# Patient Record
Sex: Female | Born: 1995 | Race: White | Hispanic: No | Marital: Married | Smoking: Never smoker
Health system: Southern US, Community
[De-identification: ages and names within clinical notes are randomized; demographics above are authoritative.]

## PROBLEM LIST (undated history)

## (undated) DIAGNOSIS — J45909 Unspecified asthma, uncomplicated: Secondary | ICD-10-CM

## (undated) DIAGNOSIS — O020 Blighted ovum and nonhydatidiform mole: Secondary | ICD-10-CM

## (undated) DIAGNOSIS — F419 Anxiety disorder, unspecified: Secondary | ICD-10-CM

## (undated) HISTORY — PX: DILATION AND CURETTAGE OF UTERUS: SHX78

## (undated) HISTORY — DX: Anxiety disorder, unspecified: F41.9

## (undated) HISTORY — PX: TONSILLECTOMY: SUR1361

## (undated) HISTORY — DX: Blighted ovum and nonhydatidiform mole: O02.0

---

## 2016-02-14 ENCOUNTER — Emergency Department (INDEPENDENT_AMBULATORY_CARE_PROVIDER_SITE_OTHER)
Admission: EM | Admit: 2016-02-14 | Discharge: 2016-02-14 | Disposition: A | Discharge status: Home / Self Care | Payer: PRIVATE HEALTH INSURANCE | Source: Home / Self Care | Attending: Family Medicine | Admitting: Family Medicine

## 2016-02-14 ENCOUNTER — Encounter: Payer: Self-pay | Admitting: *Deleted

## 2016-02-14 DIAGNOSIS — J069 Acute upper respiratory infection, unspecified: Secondary | ICD-10-CM

## 2016-02-14 DIAGNOSIS — J4521 Mild intermittent asthma with (acute) exacerbation: Secondary | ICD-10-CM | POA: Diagnosis not present

## 2016-02-14 HISTORY — DX: Unspecified asthma, uncomplicated: J45.909

## 2016-02-14 MED ORDER — AZITHROMYCIN 250 MG PO TABS: 250.0000 mg | ORAL_TABLET | Freq: Every day | ORAL | 0 refills | Status: DC

## 2016-02-14 MED ORDER — IPRATROPIUM-ALBUTEROL 0.5-2.5 (3) MG/3ML IN SOLN
3.0000 mL | Freq: Once | RESPIRATORY_TRACT | Status: AC
Start: 2016-02-14 — End: 2016-02-14
  Administered 2016-02-14: 3 mL via RESPIRATORY_TRACT

## 2016-02-14 MED ORDER — PREDNISONE 20 MG PO TABS: ORAL_TABLET | ORAL | 0 refills | Status: DC

## 2016-02-14 MED ORDER — METHYLPREDNISOLONE SODIUM SUCC 40 MG IJ SOLR
80.0000 mg | Freq: Once | INTRAMUSCULAR | Status: AC
  Administered 2016-02-14: 80 mg via INTRAMUSCULAR

## 2016-02-14 MED ORDER — ALBUTEROL SULFATE (2.5 MG/3ML) 0.083% IN NEBU: 2.5000 mg | INHALATION_SOLUTION | Freq: Four times a day (QID) | RESPIRATORY_TRACT | 12 refills | Status: AC | PRN

## 2016-02-14 MED ORDER — BENZONATATE 100 MG PO CAPS: 100.0000 mg | ORAL_CAPSULE | Freq: Three times a day (TID) | ORAL | 0 refills | Status: DC

## 2016-02-14 NOTE — Discharge Instructions (Signed)
°  You were given a shot of solumedrol (a steroid) today to help decrease inflammation in your airway to help improve your cough.  You have been prescribed 5 days of prednisone, an oral steroid.  You may start this medication tomorrow with breakfast.

## 2016-02-14 NOTE — ED Provider Notes (Signed)
CSN: [id number]     Arrival date & time 02/14/16  1306 History   First MD Initiated Contact with Patient 02/14/16 1332     Chief Complaint  Patient presents with  . Cough   (Consider location/radiation/quality/duration/timing/severity/associated sxs/prior Treatment) HPI Theresa Heath is a 20 y.o. female presenting to UC with c/o dry cough for 1 month, which became productive yesterday.  She has a hx of asthma and has needed to use her inhaler more often than she knows she should.  She contacted her teledoc service, was placed on prednisone and tessalon.  Once she completed the prednisone, her cough came back.  She was then prescribed methylprednisolone dose pack, which she completed about 1 week ago.  She feels mild fatigue but otherwise well, no other symptoms. Denies fever, chills, n/v/d. No sick contacts or recent travel.   Past Medical History:  Diagnosis Date  . Asthma    Past Surgical History:  Procedure Laterality Date  . DILATION AND CURETTAGE OF UTERUS    . TONSILLECTOMY     History reviewed. No pertinent family history. Social History  Substance Use Topics  . Smoking status: Never Smoker  . Smokeless tobacco: Never Used  . Alcohol use No   OB History    No data available     Review of Systems  Constitutional: Positive for fatigue. Negative for chills and fever.  HENT: Positive for congestion. Negative for rhinorrhea, sinus pressure and sore throat.   Respiratory: Positive for cough and wheezing. Negative for chest tightness and shortness of breath.   Cardiovascular: Negative for chest pain, palpitations and leg swelling.  Gastrointestinal: Negative for diarrhea, nausea and vomiting.    Allergies  Review of patient's allergies indicates no known allergies.  Home Medications   Prior to Admission medications   Medication Sig Start Date End Date Taking? Authorizing Provider  Albuterol Sulfate (PROAIR HFA IN) Inhale into the lungs.   Yes Historical Provider, MD   Budesonide-Formoterol Fumarate (SYMBICORT IN) Inhale into the lungs.   Yes Historical Provider, MD  cetirizine (ZYRTEC) 10 MG tablet Take 10 mg by mouth daily.   Yes Historical Provider, MD  albuterol (PROVENTIL) (2.5 MG/3ML) 0.083% nebulizer solution Take 3 mLs (2.5 mg total) by nebulization every 6 (six) hours as needed for wheezing or shortness of breath. 02/14/16   Junius Finner, PA-C  azithromycin (ZITHROMAX) 250 MG tablet Take 1 tablet (250 mg total) by mouth daily. Take first 2 tablets together, then 1 every day until finished. 02/14/16   Junius Finner, PA-C  benzonatate (TESSALON) 100 MG capsule Take 1-2 capsules (100-200 mg total) by mouth every 8 (eight) hours. 02/14/16   Junius Finner, PA-C  predniSONE (DELTASONE) 20 MG tablet 3 tabs po day one, then 2 po daily x 4 days 02/14/16   Junius Finner, PA-C   Meds Ordered and Administered this Visit   Medications  methylPREDNISolone sodium succinate (SOLU-MEDROL) 40 mg/mL injection 80 mg (80 mg Intramuscular Given 02/14/16 1346)  ipratropium-albuterol (DUONEB) 0.5-2.5 (3) MG/3ML nebulizer solution 3 mL (3 mLs Nebulization Given 02/14/16 1346)    BP 125/88 (BP Location: Left Arm)   Pulse 111   Temp 98 F (36.7 C) (Oral)   Resp 16   Wt 105 lb (47.6 kg)   LMP 01/21/2016   SpO2 98%  No data found.   Physical Exam  Constitutional: She is oriented to person, place, and time. She appears well-developed and well-nourished. No distress.  HENT:  Head: Normocephalic and atraumatic.  Right Ear:  Tympanic membrane normal.  Left Ear: Tympanic membrane normal.  Nose: Nose normal.  Mouth/Throat: Uvula is midline, oropharynx is clear and moist and mucous membranes are normal.  Eyes: EOM are normal.  Neck: Normal range of motion. Neck supple.  Cardiovascular: Normal rate and regular rhythm.   Pulmonary/Chest: Effort normal. No respiratory distress. She has wheezes ( diffuse, worse in upper lung fields). She has no rales.  Musculoskeletal:  Normal range of motion.  Neurological: She is alert and oriented to person, place, and time.  Skin: Skin is warm and dry. She is not diaphoretic.  Psychiatric: She has a normal mood and affect. Her behavior is normal.  Nursing note and vitals reviewed.   Urgent Care Course   Clinical Course    Procedures (including critical care time)  Labs Review Labs Reviewed - No data to display  Imaging Review No results found.   MDM   1. Acute upper respiratory infection   2. Mild intermittent asthma with exacerbation    Pt c/o 1 month of cough that returns after completing prednisone treatments prescribed by her teledoc.    Wheeze noted on exam w/o evidence of respiratory distress. Tx in UC: Solumedrol 80mg  IM and duoneb. Pt states she feels much better. Wheeze resolved.  Rx: Prednisone (5 days to start tomorrow), tessalon, albuterol nebulizer solution refill, and prescription for Azithromycin to cover for atypical bacteria due to duration of cough.  F/u with PCP in 5-7 days if not improving.     Junius Finnerrin O'Malley, PA-C 02/14/16 [phone]

## 2016-02-14 NOTE — ED Triage Notes (Signed)
Patient c/o 1 month of dry cough that became productive yesterday. Afebrile. H/o asthma, using inhaler frequently. She contacted her teledoc service, placed on prednisone and tessalon. Once completed cough returned. Later prescribed methylprednisolone

## 2016-03-07 ENCOUNTER — Encounter: Payer: Self-pay | Admitting: *Deleted

## 2016-03-07 ENCOUNTER — Emergency Department (INDEPENDENT_AMBULATORY_CARE_PROVIDER_SITE_OTHER)
Admission: EM | Admit: 2016-03-07 | Discharge: 2016-03-07 | Disposition: A | Discharge status: Home / Self Care | Payer: PRIVATE HEALTH INSURANCE | Source: Home / Self Care | Attending: Family Medicine | Admitting: Family Medicine

## 2016-03-07 ENCOUNTER — Emergency Department (INDEPENDENT_AMBULATORY_CARE_PROVIDER_SITE_OTHER): Payer: PRIVATE HEALTH INSURANCE

## 2016-03-07 DIAGNOSIS — J4521 Mild intermittent asthma with (acute) exacerbation: Secondary | ICD-10-CM

## 2016-03-07 DIAGNOSIS — J40 Bronchitis, not specified as acute or chronic: Secondary | ICD-10-CM

## 2016-03-07 DIAGNOSIS — R0602 Shortness of breath: Secondary | ICD-10-CM | POA: Diagnosis not present

## 2016-03-07 DIAGNOSIS — R05 Cough: Secondary | ICD-10-CM

## 2016-03-07 DIAGNOSIS — Z8709 Personal history of other diseases of the respiratory system: Secondary | ICD-10-CM

## 2016-03-07 MED ORDER — AZITHROMYCIN 250 MG PO TABS
250.0000 mg | ORAL_TABLET | Freq: Every day | ORAL | 0 refills | Status: DC
Start: 2016-03-07 — End: 2016-04-12

## 2016-03-07 MED ORDER — BENZONATATE 100 MG PO CAPS: 100.0000 mg | ORAL_CAPSULE | Freq: Three times a day (TID) | ORAL | 0 refills | Status: DC

## 2016-03-07 MED ORDER — METHYLPREDNISOLONE SODIUM SUCC 40 MG IJ SOLR
80.0000 mg | Freq: Once | INTRAMUSCULAR | Status: AC
  Administered 2016-03-07: 80 mg via INTRAMUSCULAR

## 2016-03-07 MED ORDER — IPRATROPIUM-ALBUTEROL 0.5-2.5 (3) MG/3ML IN SOLN
3.0000 mL | Freq: Once | RESPIRATORY_TRACT | Status: AC
  Administered 2016-03-07: 3 mL via RESPIRATORY_TRACT

## 2016-03-07 MED ORDER — PREDNISONE 20 MG PO TABS: ORAL_TABLET | ORAL | 0 refills | Status: DC

## 2016-03-07 NOTE — ED Provider Notes (Signed)
CSN: [id number]     Arrival date & time 03/07/16  1500 History   First MD Initiated Contact with Patient 03/07/16 1624     Chief Complaint  Patient presents with  . Asthma  . Cough   (Consider location/radiation/quality/duration/timing/severity/associated sxs/prior Treatment) HPI Theresa Heath is a 20 y.o. female presenting to UC with hx of asthma c/o asthma exacerbation with chest tightness and SOB for about 2-3 days, worsening dry cough.  Pt notes it feels like she cannot get a full breath despite using her albuterol inhaler and nebulizer treatments multiple times a day.  She does feel her heart rate is faster due to having to use her inhaler more. Mild congestion. Denies fever, chills, n/v/d. No sick contacts or recent travel. Denies chest pain or leg pain. No hx of blood clots. She is not on birth control.   Past Medical History:  Diagnosis Date  . Asthma    Past Surgical History:  Procedure Laterality Date  . DILATION AND CURETTAGE OF UTERUS    . TONSILLECTOMY     History reviewed. No pertinent family history. Social History  Substance Use Topics  . Smoking status: Never Smoker  . Smokeless tobacco: Never Used  . Alcohol use No   OB History    No data available     Review of Systems  Constitutional: Negative for chills and fever.  HENT: Positive for congestion. Negative for ear pain, sore throat, trouble swallowing and voice change.   Respiratory: Positive for cough, chest tightness, shortness of breath and wheezing.   Cardiovascular: Negative for chest pain and palpitations.  Gastrointestinal: Negative for abdominal pain, diarrhea, nausea and vomiting.  Musculoskeletal: Negative for arthralgias, back pain and myalgias.  Skin: Negative for rash.    Allergies  Patient has no known allergies.  Home Medications   Prior to Admission medications   Medication Sig Start Date End Date Taking? Authorizing Provider  albuterol (PROVENTIL) (2.5 MG/3ML) 0.083% nebulizer  solution Take 3 mLs (2.5 mg total) by nebulization every 6 (six) hours as needed for wheezing or shortness of breath. 02/14/16   Junius FinnerErin O'Malley, PA-C  Albuterol Sulfate (PROAIR HFA IN) Inhale into the lungs.    Historical Provider, MD  azithromycin (ZITHROMAX) 250 MG tablet Take 1 tablet (250 mg total) by mouth daily. Take first 2 tablets together, then 1 every day until finished. 03/07/16   Junius FinnerErin O'Malley, PA-C  benzonatate (TESSALON) 100 MG capsule Take 1-2 capsules (100-200 mg total) by mouth every 8 (eight) hours. 03/07/16   Junius FinnerErin O'Malley, PA-C  Budesonide-Formoterol Fumarate (SYMBICORT IN) Inhale into the lungs.    Historical Provider, MD  cetirizine (ZYRTEC) 10 MG tablet Take 10 mg by mouth daily.    Historical Provider, MD  predniSONE (DELTASONE) 20 MG tablet 3 tabs po day one, then 2 po daily x 4 days 03/07/16   Junius FinnerErin O'Malley, PA-C   Meds Ordered and Administered this Visit   Medications  ipratropium-albuterol (DUONEB) 0.5-2.5 (3) MG/3ML nebulizer solution 3 mL (3 mLs Nebulization Given 03/07/16 1609)  methylPREDNISolone sodium succinate (SOLU-MEDROL) 40 mg/mL injection 80 mg (80 mg Intramuscular Given 03/07/16 1645)    BP 121/75 (BP Location: Left Arm)   Pulse (!) 124   Temp 98 F (36.7 C) (Oral)   Resp 18   Wt 105 lb (47.6 kg)   LMP 02/17/2016   SpO2 98%  No data found.   Physical Exam  Constitutional: She appears well-developed and well-nourished. No distress.  HENT:  Head: Normocephalic and atraumatic.  Right Ear: Tympanic membrane normal.  Left Ear: Tympanic membrane normal.  Nose: Nose normal.  Mouth/Throat: Uvula is midline, oropharynx is clear and moist and mucous membranes are normal.  Eyes: Conjunctivae are normal. No scleral icterus.  Neck: Normal range of motion. Neck supple.  Cardiovascular: Regular rhythm and normal heart sounds.  Tachycardia present.   Pulmonary/Chest: Effort normal. No stridor. No respiratory distress. She has wheezes. She has no rales. She  exhibits no tenderness.  Faint expiratory wheeze in lower lung fields. No respiratory distress.  Abdominal: Soft. She exhibits no distension. There is no tenderness.  Musculoskeletal: Normal range of motion.  Lymphadenopathy:    She has no cervical adenopathy.  Neurological: She is alert.  Skin: Skin is warm and dry. She is not diaphoretic.  Nursing note and vitals reviewed.   Urgent Care Course   Clinical Course     Procedures (including critical care time)  Labs Review Labs Reviewed - No data to display  Imaging Review Dg Chest 2 View  Result Date: 03/07/2016 CLINICAL DATA:  Shortness of breath and cough for the past month. History of asthma. EXAM: CHEST  2 VIEW COMPARISON:  None in PACs FINDINGS: The lungs are mildly hyperinflated. The interstitial markings are coarse bilaterally. There is no alveolar infiltrate. There is no pleural effusion. The heart and pulmonary vascularity are normal. The mediastinum is normal in width. The bony thorax exhibits no acute abnormality. IMPRESSION: Interstitial prominence consistent with bronchitic changes an underlying reactive airway disease. No focal pneumonia. Electronically Signed   By: David  SwazilandJordan M.D.   On: 03/07/2016 16:54     MDM   1. Mild intermittent asthma with exacerbation   2. Bronchitis    Pt c/o worsening asthma symptoms for 2-3 days.  O2 Sat 98% on RA. HR- 124, likely due to repeated albuterol treatments throughout today. Doubt PE.  Tx in UC: Solumedrol 80mg  IM and duoneb  CXR: c/w bronchitis changes an underlying reactive airway disease w/o focal pneumonia.  Rx: Prednisone, Tessalon, and albuterol  F/u with PCP in 1 week if not improving, sooner if worsening.     Junius Finnerrin O'Malley, PA-C 03/07/16 [phone]

## 2016-03-07 NOTE — Discharge Instructions (Signed)
°  You were given a shot of solumedrol (a steroid) today to help with your breathing and inflammation in your lungs.  You have been prescribed prednisone, an oral steroid.  You may start this medication tomorrow with breakfast.

## 2016-03-07 NOTE — ED Triage Notes (Addendum)
Pt c/o wheezing and unable to take a full breath. H/o asthma. Cough x 2-3 days. Using [employer] Inhaler and nebs @ home.

## 2016-04-12 ENCOUNTER — Emergency Department (INDEPENDENT_AMBULATORY_CARE_PROVIDER_SITE_OTHER)
Admission: EM | Admit: 2016-04-12 | Discharge: 2016-04-12 | Disposition: A | Discharge status: Home / Self Care | Payer: PRIVATE HEALTH INSURANCE | Source: Home / Self Care | Attending: Family Medicine | Admitting: Family Medicine

## 2016-04-12 ENCOUNTER — Encounter: Payer: Self-pay | Admitting: Emergency Medicine

## 2016-04-12 DIAGNOSIS — J4540 Moderate persistent asthma, uncomplicated: Secondary | ICD-10-CM | POA: Diagnosis not present

## 2016-04-12 MED ORDER — BENZONATATE 200 MG PO CAPS: ORAL_CAPSULE | ORAL | 0 refills | Status: DC

## 2016-04-12 MED ORDER — ALBUTEROL SULFATE HFA 108 (90 BASE) MCG/ACT IN AERS: 2.0000 | INHALATION_SPRAY | RESPIRATORY_TRACT | 1 refills | Status: DC | PRN

## 2016-04-12 MED ORDER — TIOTROPIUM BROMIDE MONOHYDRATE 1.25 MCG/ACT IN AERS: 2.0000 | INHALATION_SPRAY | Freq: Every day | RESPIRATORY_TRACT | 1 refills | Status: DC

## 2016-04-12 MED ORDER — METHYLPREDNISOLONE ACETATE 80 MG/ML IJ SUSP
80.0000 mg | Freq: Once | INTRAMUSCULAR | Status: AC
  Administered 2016-04-12: 80 mg via INTRAMUSCULAR

## 2016-04-12 MED ORDER — IPRATROPIUM-ALBUTEROL 0.5-2.5 (3) MG/3ML IN SOLN
3.0000 mL | Freq: Once | RESPIRATORY_TRACT | Status: AC
  Administered 2016-04-12: 3 mL via RESPIRATORY_TRACT

## 2016-04-12 NOTE — Discharge Instructions (Signed)
Take plain guaifenesin (1200mg  extended release tabs such as Mucinex) twice daily, with plenty of water, for cough and congestion.     Try Flonase nasal spray each morning for nasal congestion. Continue Symbicort inhaler and albuterol inhaler.

## 2016-04-12 NOTE — ED Triage Notes (Signed)
Pt c/o cough and asthma x 2-3 months, dx w/bronchitis x 3 weeks ago, now cough is productive cough and wheezing.

## 2016-04-12 NOTE — ED Provider Notes (Addendum)
[facility]    CSN: [id number] Arrival date & time: 04/12/16  1645     History   Chief Complaint Chief Complaint  Patient presents with  . Cough    HPI Theresa Heath is a 20 y.o. female.   Patient has a history of asthma and seasonal rhinitis, for which she uses occasional albuterol MDI and daily Zyrtec.  She also has Symbicort which she has not used recently.  She states that her cough and wheezing have been persistent for about 3 months.  She was diagnosed with bronchitis about 3 weeks ago and her cough and wheezing have worsened.  No pleuritic pain.  No fevers, chills, and sweats.  She states that she had tried Singulair in the past but it caused leg cramps.   The history is provided by the patient.    Past Medical History:  Diagnosis Date  . Asthma     There are no active problems to display for this patient.   Past Surgical History:  Procedure Laterality Date  . DILATION AND CURETTAGE OF UTERUS    . TONSILLECTOMY      OB History    No data available       Home Medications    Prior to Admission medications   Medication Sig Start Date End Date Taking? Authorizing Provider  albuterol (PROVENTIL HFA;VENTOLIN HFA) 108 (90 Base) MCG/ACT inhaler Inhale 2 puffs into the lungs every 4 (four) hours as needed for wheezing or shortness of breath. 04/12/16   Lattie HawStephen A Mariacristina Aday, MD  albuterol (PROVENTIL) (2.5 MG/3ML) 0.083% nebulizer solution Take 3 mLs (2.5 mg total) by nebulization every 6 (six) hours as needed for wheezing or shortness of breath. 02/14/16   Junius FinnerErin O'Malley, PA-C  benzonatate (TESSALON) 200 MG capsule Take one cap by mouth at bedtime as needed for cough.  May repeat in 4 to 6 hours 04/12/16   Lattie HawStephen A Mihira Tozzi, MD  Tiotropium Bromide Monohydrate (SPIRIVA RESPIMAT) 1.25 MCG/ACT AERS Inhale 2 puffs into the lungs daily. 04/12/16   Lattie HawStephen A Jahlisa Rossitto, MD    Family History No family history on file.  Social History Social History  Substance Use  Topics  . Smoking status: Never Smoker  . Smokeless tobacco: Never Used  . Alcohol use No     Allergies   Patient has no known allergies.   Review of Systems Review of Systems + occasional sore throat + cough No pleuritic pain + wheezing + nasal congestion + post-nasal drainage No sinus pain/pressure No itchy/red eyes No earache No hemoptysis + SOB No fever/chills No nausea No vomiting No abdominal pain No diarrhea No urinary symptoms No skin rash + fatigue No myalgias No headache Used OTC meds without relief   Physical Exam Triage Vital Signs ED Triage Vitals  Enc Vitals Group     BP 04/12/16 1831 120/82     Pulse Rate 04/12/16 1831 119     Resp --      Temp 04/12/16 1831 98 F (36.7 C)     Temp Source 04/12/16 1831 Oral     SpO2 04/12/16 1831 96 %     Weight 04/12/16 1831 106 lb 8 oz (48.3 kg)     Height 04/12/16 1831 5\' 1"  (1.549 m)     Head Circumference --      Peak Flow --      Pain Score 04/12/16 1833 0     Pain Loc --      Pain Edu? --  Excl. in GC? --    No data found.   Updated Vital Signs BP 120/82 (BP Location: Left Arm)   Pulse 119   Temp 98 F (36.7 C) (Oral)   Ht 5\' 1"  (1.549 m)   Wt 106 lb 8 oz (48.3 kg)   LMP 03/23/2016   SpO2 96%   BMI 20.12 kg/m   Visual Acuity Right Eye Distance:   Left Eye Distance:   Bilateral Distance:    Right Eye Near:   Left Eye Near:    Bilateral Near:     Physical Exam Nursing notes and Vital Signs reviewed. Appearance:  Patient appears stated age, and in no acute distress Eyes:  Pupils are equal, round, and reactive to light and accomodation.  Extraocular movement is intact.  Conjunctivae are not inflamed  Ears:  Canals normal.  Tympanic membranes normal.  Nose:  Mildly congested turbinates.  No sinus tenderness.    Pharynx:  Normal Neck:  Supple.  Tender enlarged posterior/lateral nodes are palpated bilaterally  Lungs:   Diffusely scattered bilateral expiratory wheezes.  Breath  sounds are equal.  Moving air well.  Post neb treatment, decreased wheezes heard. Heart:  Regular rate and rhythm without murmurs, rubs, or gallops.  Abdomen:  Nontender without masses or hepatosplenomegaly.  Bowel sounds are present.  No CVA or flank tenderness.  Extremities:  No edema.  Skin:  No rash present.    UC Treatments / Results  Labs (all labs ordered are listed, but only abnormal results are displayed) Labs Reviewed - No data to display  EKG  EKG Interpretation None       Radiology No results found.  Procedures Procedures (including critical care time)  Medications Ordered in UC Medications  ipratropium-albuterol (DUONEB) 0.5-2.5 (3) MG/3ML nebulizer solution 3 mL (3 mLs Nebulization Given 04/12/16 1926)  methylPREDNISolone acetate (DEPO-MEDROL) injection 80 mg (80 mg Intramuscular Given 04/12/16 1926)     Initial Impression / Assessment and Plan / UC Course  I have reviewed the triage vital signs and the nursing notes.  Pertinent labs & imaging results that were available during my care of the patient were reviewed by me and considered in my medical decision making (see chart for details).  Clinical Course   There is no evidence of bacterial infection today.   Administered Depo Medrol 80mg  IM  Administered DuoNeb by hand held nebulizer. Begin trial of Spiriva Respimat, 2 puffs daily; sample given. Refill albuterol MDI Take plain guaifenesin (1200mg  extended release tabs such as Mucinex) twice daily, with plenty of water, for cough and congestion.     Try Flonase nasal spray each morning for nasal congestion. Continue Symbicort inhaler and albuterol inhaler as prescribed. Followup with Family Doctor to establish care; consider adding asthma plan.     Final Clinical Impressions(s) / UC Diagnoses   Final diagnoses:  Moderate persistent asthma without complication    New Prescriptions New Prescriptions   ALBUTEROL (PROVENTIL HFA;VENTOLIN HFA) 108 (90  BASE) MCG/ACT INHALER    Inhale 2 puffs into the lungs every 4 (four) hours as needed for wheezing or shortness of breath.   BENZONATATE (TESSALON) 200 MG CAPSULE    Take one cap by mouth at bedtime as needed for cough.  May repeat in 4 to 6 hours   TIOTROPIUM BROMIDE MONOHYDRATE (SPIRIVA RESPIMAT) 1.25 MCG/ACT AERS    Inhale 2 puffs into the lungs daily.     Lattie HawStephen A Derricka Mertz, MD 04/28/16 1554    Lattie HawStephen A Cydnee Fuquay, MD  04/28/16 1607  

## 2016-04-22 ENCOUNTER — Encounter: Payer: Self-pay | Admitting: Osteopathic Medicine

## 2016-04-22 ENCOUNTER — Ambulatory Visit (INDEPENDENT_AMBULATORY_CARE_PROVIDER_SITE_OTHER): Payer: BLUE CROSS/BLUE SHIELD | Admitting: Osteopathic Medicine

## 2016-04-22 VITALS — BP 128/91 | HR 94 | Temp 98.2°F | Ht 61.0 in | Wt 107.0 lb

## 2016-04-22 DIAGNOSIS — J4541 Moderate persistent asthma with (acute) exacerbation: Secondary | ICD-10-CM | POA: Diagnosis not present

## 2016-04-22 MED ORDER — PREDNISONE 20 MG PO TABS: 20.0000 mg | ORAL_TABLET | Freq: Two times a day (BID) | ORAL | 1 refills | Status: DC

## 2016-04-22 MED ORDER — BUDESONIDE-FORMOTEROL FUMARATE 160-4.5 MCG/ACT IN AERO: 2.0000 | INHALATION_SPRAY | Freq: Two times a day (BID) | RESPIRATORY_TRACT | 12 refills | Status: DC

## 2016-04-22 MED ORDER — ALBUTEROL SULFATE HFA 108 (90 BASE) MCG/ACT IN AERS: 1.0000 | INHALATION_SPRAY | RESPIRATORY_TRACT | 12 refills | Status: AC | PRN

## 2016-04-22 NOTE — Patient Instructions (Signed)
Allergies: Flonase/Nasonex +/- Claritin/Zyrtec/Allegra

## 2016-04-22 NOTE — Progress Notes (Signed)
HPI: Theresa Heath is a 21 y.o. female  who presents to [facility] today, 04/22/16,  for chief complaint of:  Chief Complaint  Patient presents with  . Establish Care    cough     . Context: Hx asthma, recently has been sick more since off Symbicort. She started Spiriva by urgent care 04/12/16, at that visit UC provider advised continue Symbicort (though I don't see this on her list anywhere), refilled albuterol, gave guaifenesin. Current med list noted ProAir and Spiriva. Had been out of Symbicort all Fall d/t insurance issues, seems to have been a miscommunication w/ UC re: meds at home, I think they wanted her to add Bernardo Heater but she has been using only this, pt states Spiriva has not helped at all, it is the only inhaler she is using other than rescue inhaler.  . Location: chest/lungs, minimal sinus congestion . Quality: wheezing frequently . Duration/Timing: few months on and off. Daily symptoms w/ nighttime awakenings  . Modifying factors: see meds above     Past medical, surgical, social and family history reviewed: Patient Active Problem List   Diagnosis Date Noted  . Moderate persistent asthma with acute exacerbation 04/22/2016   Past Surgical History:  Procedure Laterality Date  . DILATION AND CURETTAGE OF UTERUS    . TONSILLECTOMY     Social History  Substance Use Topics  . Smoking status: Never Smoker  . Smokeless tobacco: Never Used  . Alcohol use No   History reviewed. No pertinent family history.   Current medication list and allergy/intolerance information reviewed:   Current Outpatient Prescriptions  Medication Sig Dispense Refill  . albuterol (PROVENTIL HFA;VENTOLIN HFA) 108 (90 Base) MCG/ACT inhaler Inhale 2 puffs into the lungs every 4 (four) hours as needed for wheezing or shortness of breath. 1 Inhaler 1  . albuterol (PROVENTIL) (2.5 MG/3ML) 0.083% nebulizer solution Take 3 mLs (2.5 mg total) by nebulization every 6  (six) hours as needed for wheezing or shortness of breath. 75 mL 12  . benzonatate (TESSALON) 200 MG capsule Take one cap by mouth at bedtime as needed for cough.  May repeat in 4 to 6 hours 30 capsule 0  . Tiotropium Bromide Monohydrate (SPIRIVA RESPIMAT) 1.25 MCG/ACT AERS Inhale 2 puffs into the lungs daily. 1 Inhaler 1   No current facility-administered medications for this visit.    No Known Allergies    Review of Systems:  Constitutional:  No  fever, no chills, +recent illness, No unintentional weight changes. No significant fatigue.   HEENT: No  headache, no vision change, no hearing change, No sore throat, No  sinus pressure  Cardiac: No  chest pain, No  pressure, No palpitations, No  Orthopnea  Respiratory:  +shortness of breath. +Cough  Gastrointestinal: No  abdominal pain, No  nausea, No  vomiting,  No  blood in stool, No  diarrhea, No  constipation   Musculoskeletal: No new myalgia/arthralgia  Genitourinary: No  incontinence, No  abnormal genital bleeding, No abnormal genital discharge  Skin: No  Rash, No other wounds/concerning lesions  Hem/Onc: No  easy bruising/bleeding, No  abnormal lymph node  Endocrine: No cold intolerance,  No heat intolerance. No polyuria/polydipsia/polyphagia   Neurologic: No  weakness, No  dizziness, No  slurred speech/focal weakness/facial droop  Psychiatric: No  concerns with depression, No  concerns with anxiety, No sleep problems, No mood problems  Exam:  BP (!) 128/91   Pulse 94   Temp 98.2 F (36.8 C) (  Oral)   Ht 5\' 1"  (1.549 m)   Wt 107 lb (48.5 kg)   LMP 03/23/2016   BMI 20.22 kg/m   Constitutional: VS see above. General Appearance: alert, well-developed, well-nourished, NAD  Eyes: Normal lids and conjunctive, non-icteric sclera  Ears, Nose, Mouth, Throat: MMM, Normal external inspection ears/nares/mouth/lips/gums. TM normal bilaterally. Pharynx/tonsils no erythema, no exudate. Nasal mucosa normal.   Neck: No masses,  trachea midline. No thyroid enlargement. No tenderness/mass appreciated. No lymphadenopathy  Respiratory: Normal respiratory effort. + bilateral diffuse wheeze, no rhonchi, no rales  Cardiovascular: S1/S2 normal, no murmur, no rub/gallop auscultated. RRR. No lower extremity edema.  Musculoskeletal: Gait normal. No clubbing/cyanosis of digits.   Neurological: Normal balance/coordination. No tremor.  Skin: warm, dry, intact. No rash/ulcer.   Psychiatric: Normal judgment/insight. Normal mood and affect. Oriented x3.      ASSESSMENT/PLAN:   Restart Symbicort  D/C spiriva  Continue rescue inhaler  Abx if no better w/ steroids or if worse despite steroids  Optimize OTC allergy treatment, pt states Hx side effects w/ Singulair - may consider allergy referral, pt previously on injections  Moderate persistent asthma with acute exacerbation - Plan: budesonide-formoterol (SYMBICORT) 160-4.5 MCG/ACT inhaler, albuterol (PROVENTIL HFA;VENTOLIN HFA) 108 (90 Base) MCG/ACT inhaler, predniSONE (DELTASONE) 20 MG tablet    Patient Instructions  Allergies: Flonase/Nasonex +/- Claritin/Zyrtec/Allegra     Visit summary with medication list and pertinent instructions was printed for patient to review. All questions at time of visit were answered - patient instructed to contact office with any additional concerns. ER/RTC precautions were reviewed with the patient. Follow-up plan: Return in about 4 weeks (around 05/20/2016) for ASTHMA FOLLOWUP IF NO OR MINIMAL IMPROVEMENT, SOONER IF WORSE .

## 2016-08-28 DIAGNOSIS — Z3A12 12 weeks gestation of pregnancy: Secondary | ICD-10-CM | POA: Diagnosis not present

## 2016-08-28 DIAGNOSIS — Z3A20 20 weeks gestation of pregnancy: Secondary | ICD-10-CM | POA: Diagnosis not present

## 2016-08-28 DIAGNOSIS — Z3682 Encounter for antenatal screening for nuchal translucency: Secondary | ICD-10-CM | POA: Diagnosis not present

## 2016-08-28 DIAGNOSIS — Z3481 Encounter for supervision of other normal pregnancy, first trimester: Secondary | ICD-10-CM | POA: Diagnosis not present

## 2016-08-28 DIAGNOSIS — Z3480 Encounter for supervision of other normal pregnancy, unspecified trimester: Secondary | ICD-10-CM | POA: Diagnosis not present

## 2016-08-28 DIAGNOSIS — Z3482 Encounter for supervision of other normal pregnancy, second trimester: Secondary | ICD-10-CM | POA: Diagnosis not present

## 2016-08-28 DIAGNOSIS — Z3A08 8 weeks gestation of pregnancy: Secondary | ICD-10-CM | POA: Diagnosis not present

## 2016-09-25 DIAGNOSIS — Z3A12 12 weeks gestation of pregnancy: Secondary | ICD-10-CM | POA: Diagnosis not present

## 2016-09-25 DIAGNOSIS — Z3682 Encounter for antenatal screening for nuchal translucency: Secondary | ICD-10-CM | POA: Diagnosis not present

## 2016-10-24 DIAGNOSIS — Z363 Encounter for antenatal screening for malformations: Secondary | ICD-10-CM | POA: Diagnosis not present

## 2016-11-20 DIAGNOSIS — Z363 Encounter for antenatal screening for malformations: Secondary | ICD-10-CM | POA: Diagnosis not present

## 2016-11-20 DIAGNOSIS — Z3A20 20 weeks gestation of pregnancy: Secondary | ICD-10-CM | POA: Diagnosis not present

## 2016-12-01 DIAGNOSIS — Z369 Encounter for antenatal screening, unspecified: Secondary | ICD-10-CM | POA: Diagnosis not present

## 2016-12-31 DIAGNOSIS — Z369 Encounter for antenatal screening, unspecified: Secondary | ICD-10-CM | POA: Diagnosis not present

## 2017-02-04 DIAGNOSIS — Z23 Encounter for immunization: Secondary | ICD-10-CM | POA: Diagnosis not present

## 2017-02-04 DIAGNOSIS — Z369 Encounter for antenatal screening, unspecified: Secondary | ICD-10-CM | POA: Diagnosis not present

## 2017-02-04 DIAGNOSIS — Z113 Encounter for screening for infections with a predominantly sexual mode of transmission: Secondary | ICD-10-CM | POA: Diagnosis not present

## 2017-03-11 DIAGNOSIS — Z369 Encounter for antenatal screening, unspecified: Secondary | ICD-10-CM | POA: Diagnosis not present

## 2017-04-13 DIAGNOSIS — O9952 Diseases of the respiratory system complicating childbirth: Secondary | ICD-10-CM | POA: Diagnosis not present

## 2017-04-13 DIAGNOSIS — Z3A40 40 weeks gestation of pregnancy: Secondary | ICD-10-CM | POA: Diagnosis not present

## 2017-04-13 DIAGNOSIS — Z4682 Encounter for fitting and adjustment of non-vascular catheter: Secondary | ICD-10-CM | POA: Diagnosis not present

## 2017-04-13 DIAGNOSIS — Z452 Encounter for adjustment and management of vascular access device: Secondary | ICD-10-CM | POA: Diagnosis not present

## 2017-04-13 DIAGNOSIS — O4202 Full-term premature rupture of membranes, onset of labor within 24 hours of rupture: Secondary | ICD-10-CM | POA: Diagnosis not present

## 2017-04-13 DIAGNOSIS — Z3A41 41 weeks gestation of pregnancy: Secondary | ICD-10-CM | POA: Diagnosis not present

## 2017-04-13 DIAGNOSIS — O328XX0 Maternal care for other malpresentation of fetus, not applicable or unspecified: Secondary | ICD-10-CM | POA: Diagnosis not present

## 2017-04-13 DIAGNOSIS — Z23 Encounter for immunization: Secondary | ICD-10-CM | POA: Diagnosis not present

## 2017-04-13 DIAGNOSIS — J45909 Unspecified asthma, uncomplicated: Secondary | ICD-10-CM | POA: Diagnosis not present

## 2017-04-13 DIAGNOSIS — Z3483 Encounter for supervision of other normal pregnancy, third trimester: Secondary | ICD-10-CM | POA: Diagnosis not present

## 2017-04-14 DIAGNOSIS — Z3A41 41 weeks gestation of pregnancy: Secondary | ICD-10-CM | POA: Diagnosis not present

## 2017-04-14 DIAGNOSIS — Z4682 Encounter for fitting and adjustment of non-vascular catheter: Secondary | ICD-10-CM | POA: Diagnosis not present

## 2017-04-14 DIAGNOSIS — O328XX0 Maternal care for other malpresentation of fetus, not applicable or unspecified: Secondary | ICD-10-CM | POA: Diagnosis not present

## 2017-04-16 DIAGNOSIS — Z452 Encounter for adjustment and management of vascular access device: Secondary | ICD-10-CM | POA: Diagnosis not present

## 2017-04-23 ENCOUNTER — Other Ambulatory Visit: Payer: Self-pay | Admitting: Osteopathic Medicine

## 2017-04-23 DIAGNOSIS — J4541 Moderate persistent asthma with (acute) exacerbation: Secondary | ICD-10-CM

## 2017-09-22 ENCOUNTER — Other Ambulatory Visit: Payer: Self-pay

## 2017-09-22 ENCOUNTER — Emergency Department
Admission: EM | Admit: 2017-09-22 | Discharge: 2017-09-22 | Disposition: A | Discharge status: Home / Self Care | Payer: BLUE CROSS/BLUE SHIELD | Source: Home / Self Care | Attending: Family Medicine | Admitting: Family Medicine

## 2017-09-22 ENCOUNTER — Encounter: Payer: Self-pay | Admitting: *Deleted

## 2017-09-22 DIAGNOSIS — J029 Acute pharyngitis, unspecified: Secondary | ICD-10-CM | POA: Diagnosis not present

## 2017-09-22 LAB — POCT RAPID STREP A (OFFICE): Rapid Strep A Screen: NEGATIVE

## 2017-09-22 NOTE — ED Provider Notes (Signed)
Theresa Heath CARE    CSN: [id number] Arrival date & time: 09/22/17  1154     History   Chief Complaint Chief Complaint  Patient presents with  . Sore Throat    HPI [name] is a 22 y.o. female.   HPI Theresa Heath is a 22 y.o. female presenting to UC with c/o sore throat and post-nasal drip for about 1 week. Pain started to worsen this morning and she noticed a red spot in the back of her throat.  Pain is worse with swallowing but she is able to keep down fluids. Denies fever, chills, n/v/d. No cough. Pt is breastfeeding her 78mo old daughter.    Past Medical History:  Diagnosis Date  . Asthma     Patient Active Problem List   Diagnosis Date Noted  . Sore throat 09/22/2017  . Moderate persistent asthma with acute exacerbation 04/22/2016    Past Surgical History:  Procedure Laterality Date  . DILATION AND CURETTAGE OF UTERUS    . TONSILLECTOMY      OB History   None      Home Medications    Prior to Admission medications   Medication Sig Start Date End Date Taking? Authorizing Provider  albuterol (PROVENTIL HFA;VENTOLIN HFA) 108 (90 Base) MCG/ACT inhaler Inhale 1-2 puffs into the lungs every 4 (four) hours as needed for wheezing or shortness of breath. 04/22/16   Sunnie Nielsen, DO  albuterol (PROVENTIL) (2.5 MG/3ML) 0.083% nebulizer solution Take 3 mLs (2.5 mg total) by nebulization every 6 (six) hours as needed for wheezing or shortness of breath. 02/14/16   Lurene Shadow, PA-C  budesonide-formoterol (SYMBICORT) 160-4.5 MCG/ACT inhaler Inhale 2 puffs into the lungs 2 (two) times daily. 04/22/16   Sunnie Nielsen, DO  norethindrone (MICRONOR,CAMILA,ERRIN) 0.35 MG tablet TAKE 1 TABLET BY MOUTH ONCE A DAY AT THE SAME TIME EACH DAY 08/22/17   [provider]    Family History History reviewed. No pertinent family history.  Social History Social History   Tobacco Use  . Smoking status: Never Smoker  . Smokeless tobacco: Never Used    Substance Use Topics  . Alcohol use: No  . Drug use: No     Allergies   Patient has no known allergies.   Review of Systems Review of Systems  Constitutional: Negative for chills and fever.  HENT: Positive for postnasal drip and sore throat. Negative for congestion, ear pain, trouble swallowing and voice change.   Respiratory: Negative for cough and shortness of breath.   Cardiovascular: Negative for chest pain and palpitations.  Gastrointestinal: Negative for abdominal pain, diarrhea, nausea and vomiting.  Musculoskeletal: Negative for arthralgias, back pain and myalgias.  Skin: Negative for rash.     Physical Exam Triage Vital Signs ED Triage Vitals  Enc Vitals Group     BP      Pulse      Resp      Temp      Temp src      SpO2      Weight      Height      Head Circumference      Peak Flow      Pain Score      Pain Loc      Pain Edu?      Excl. in GC?    No data found.  Updated Vital Signs BP 111/72 (BP Location: Right Arm)   Pulse 75   Temp 97.7 F (36.5 C) (Oral)  Resp 16   Ht 5\' 1"  (1.549 m)   Wt 109 lb (49.4 kg)   LMP 08/01/2017   SpO2 97%   BMI 20.60 kg/m   Visual Acuity Right Eye Distance:   Left Eye Distance:   Bilateral Distance:    Right Eye Near:   Left Eye Near:    Bilateral Near:     Physical Exam  Constitutional: She is oriented to person, place, and time. She appears well-developed and well-nourished.  Non-toxic appearance. She does not appear ill. No distress.  HENT:  Head: Normocephalic and atraumatic.  Right Ear: Tympanic membrane normal.  Left Ear: Tympanic membrane normal.  Nose: Nose normal.  Mouth/Throat: Uvula is midline and mucous membranes are normal. Posterior oropharyngeal erythema present. No oropharyngeal exudate, posterior oropharyngeal edema or tonsillar abscesses.  Eyes: EOM are normal.  Neck: Normal range of motion. Neck supple.  Cardiovascular: Normal rate.  Pulmonary/Chest: Effort normal and breath  sounds normal. No stridor. No respiratory distress. She has no wheezes. She has no rhonchi.  Musculoskeletal: Normal range of motion.  Lymphadenopathy:    She has no cervical adenopathy.  Neurological: She is alert and oriented to person, place, and time.  Skin: Skin is warm and dry.  Psychiatric: She has a normal mood and affect. Her behavior is normal.  Nursing note and vitals reviewed.    UC Treatments / Results  Labs (all labs ordered are listed, but only abnormal results are displayed) Labs Reviewed  STREP A DNA PROBE  POCT RAPID STREP A (OFFICE)    EKG None  Radiology No results found.  Procedures Procedures (including critical care time)  Medications Ordered in UC Medications - No data to display  Initial Impression / Assessment and Plan / UC Course  I have reviewed the triage vital signs and the nursing notes.  Pertinent labs & imaging results that were available during my care of the patient were reviewed by me and considered in my medical decision making (see chart for details).     Rapid strep: NEGATIVE Culture sent Encouraged symptomatic treatment.  Final Clinical Impressions(s) / UC Diagnoses   Final diagnoses:  Acute pharyngitis, unspecified etiology     Discharge Instructions      You may take 500mg  acetaminophen every 4-6 hours as needed for pain.  Please follow up with family medicine in 1 week if not improving.     ED Prescriptions    None     Controlled Substance Prescriptions [place] Controlled Substance Registry consulted? Not Applicable   Rolla Platehelps, Neftali Thurow O, PA-C 09/22/17 1329

## 2017-09-22 NOTE — Discharge Instructions (Signed)
°  You may take 500mg  acetaminophen every 4-6 hours as needed for pain.  Please follow up with family medicine in 1 week if not improving.

## 2017-09-22 NOTE — ED Triage Notes (Signed)
Pt c/o sore throat and post nasal drip x 1 wk, worse x 1 day.

## 2017-09-23 ENCOUNTER — Telehealth: Payer: Self-pay

## 2017-09-23 LAB — STREP A DNA PROBE: Group A Strep Probe: NOT DETECTED

## 2017-09-23 NOTE — Telephone Encounter (Signed)
Spoke with patient, and still feeling about the same.  Given lab results.  Will follow up as needed.

## 2017-10-26 DIAGNOSIS — Z23 Encounter for immunization: Secondary | ICD-10-CM | POA: Diagnosis not present

## 2017-10-29 ENCOUNTER — Encounter (INDEPENDENT_AMBULATORY_CARE_PROVIDER_SITE_OTHER): Payer: Self-pay

## 2017-10-29 ENCOUNTER — Ambulatory Visit (INDEPENDENT_AMBULATORY_CARE_PROVIDER_SITE_OTHER): Payer: BLUE CROSS/BLUE SHIELD

## 2017-10-29 ENCOUNTER — Encounter: Payer: Self-pay | Admitting: Physician Assistant

## 2017-10-29 ENCOUNTER — Ambulatory Visit (INDEPENDENT_AMBULATORY_CARE_PROVIDER_SITE_OTHER): Payer: BLUE CROSS/BLUE SHIELD | Admitting: Physician Assistant

## 2017-10-29 ENCOUNTER — Ambulatory Visit: Payer: BLUE CROSS/BLUE SHIELD | Admitting: Physician Assistant

## 2017-10-29 VITALS — BP 117/72 | HR 76 | Temp 97.5°F | Resp 14 | Wt 110.0 lb

## 2017-10-29 DIAGNOSIS — R1032 Left lower quadrant pain: Secondary | ICD-10-CM

## 2017-10-29 DIAGNOSIS — R3129 Other microscopic hematuria: Secondary | ICD-10-CM

## 2017-10-29 DIAGNOSIS — Z8759 Personal history of other complications of pregnancy, childbirth and the puerperium: Secondary | ICD-10-CM | POA: Diagnosis not present

## 2017-10-29 DIAGNOSIS — R110 Nausea: Secondary | ICD-10-CM | POA: Diagnosis not present

## 2017-10-29 DIAGNOSIS — Z9221 Personal history of antineoplastic chemotherapy: Secondary | ICD-10-CM

## 2017-10-29 LAB — POCT URINALYSIS DIPSTICK
BILIRUBIN UA: NEGATIVE
Glucose, UA: NEGATIVE
KETONES UA: NEGATIVE
Leukocytes, UA: NEGATIVE
NITRITE UA: NEGATIVE
PROTEIN UA: NEGATIVE
Spec Grav, UA: 1.025 (ref 1.010–1.025)
Urobilinogen, UA: 0.2 E.U./dL
pH, UA: 6.5 (ref 5.0–8.0)

## 2017-10-29 LAB — CBC WITH DIFFERENTIAL/PLATELET
Basophils Absolute: 77 cells/uL (ref 0–200)
Basophils Relative: 1 %
EOS ABS: 855 {cells}/uL — AB (ref 15–500)
Eosinophils Relative: 11.1 %
HCT: 40.3 % (ref 35.0–45.0)
HEMOGLOBIN: 13.9 g/dL (ref 11.7–15.5)
Lymphs Abs: 2379 cells/uL (ref 850–3900)
MCH: 31 pg (ref 27.0–33.0)
MCHC: 34.5 g/dL (ref 32.0–36.0)
MCV: 90 fL (ref 80.0–100.0)
MONOS PCT: 9.4 %
MPV: 11.1 fL (ref 7.5–12.5)
NEUTROS ABS: 3665 {cells}/uL (ref 1500–7800)
Neutrophils Relative %: 47.6 %
Platelets: 254 10*3/uL (ref 140–400)
RBC: 4.48 10*6/uL (ref 3.80–5.10)
RDW: 11.8 % (ref 11.0–15.0)
Total Lymphocyte: 30.9 %
WBC mixed population: 724 cells/uL (ref 200–950)
WBC: 7.7 10*3/uL (ref 3.8–10.8)

## 2017-10-29 LAB — COMPREHENSIVE METABOLIC PANEL
AG RATIO: 1.9 (calc) (ref 1.0–2.5)
ALT: 8 U/L (ref 6–29)
AST: 14 U/L (ref 10–30)
Albumin: 4.8 g/dL (ref 3.6–5.1)
Alkaline phosphatase (APISO): 96 U/L (ref 33–115)
BUN: 20 mg/dL (ref 7–25)
CHLORIDE: 104 mmol/L (ref 98–110)
CO2: 28 mmol/L (ref 20–32)
Calcium: 9.5 mg/dL (ref 8.6–10.2)
Creat: 0.71 mg/dL (ref 0.50–1.10)
GLOBULIN: 2.5 g/dL (ref 1.9–3.7)
GLUCOSE: 85 mg/dL (ref 65–139)
Potassium: 4.3 mmol/L (ref 3.5–5.3)
SODIUM: 139 mmol/L (ref 135–146)
TOTAL PROTEIN: 7.3 g/dL (ref 6.1–8.1)
Total Bilirubin: 0.6 mg/dL (ref 0.2–1.2)

## 2017-10-29 LAB — POCT URINE PREGNANCY: Preg Test, Ur: NEGATIVE

## 2017-10-29 LAB — LIPASE: Lipase: 26 U/L (ref 7–60)

## 2017-10-29 MED ORDER — CEPHALEXIN 500 MG PO CAPS: 500.0000 mg | ORAL_CAPSULE | Freq: Two times a day (BID) | ORAL | 0 refills | Status: DC

## 2017-10-29 NOTE — Patient Instructions (Signed)

## 2017-10-29 NOTE — Progress Notes (Signed)
HPI:                                                                Theresa Heath is a 22 y.o. female who presents to [facility]: Primary [facility] Medicine today for abdominal pain  Abdominal pain, gradually worsening for 4 weeks. Pain is LLQ radiating to the flank and back Pain is described as jabbing and constant Worse with activity No relationship with bowel movements or eating Associated with chills and nausea She is breastfeeding, daughter is 6 months, LMP 07/01/16  Hx of molar pregnancy in 2013, underwent D&C and chemotherapy Denies vaginal discharge/odor/pruritis, dyspareunia. No hx of of STI. Sexually active with 1 female partner. Denies fever, vomiting, change in bowel habits, hematochezia, melena.  No flowsheet data found.  No flowsheet data found.    Past Medical History:  Diagnosis Date  . Asthma    Past Surgical History:  Procedure Laterality Date  . DILATION AND CURETTAGE OF UTERUS    . TONSILLECTOMY     Social History   Tobacco Use  . Smoking status: Never Smoker  . Smokeless tobacco: Never Used  Substance Use Topics  . Alcohol use: No   family history is not on file.    ROS: negative except as noted in the HPI  Medications: Current Outpatient Medications  Medication Sig Dispense Refill  . albuterol (PROVENTIL HFA;VENTOLIN HFA) 108 (90 Base) MCG/ACT inhaler Inhale 1-2 puffs into the lungs every 4 (four) hours as needed for wheezing or shortness of breath. 1 Inhaler 12  . albuterol (PROVENTIL) (2.5 MG/3ML) 0.083% nebulizer solution Take 3 mLs (2.5 mg total) by nebulization every 6 (six) hours as needed for wheezing or shortness of breath. 75 mL 12  . budesonide-formoterol (SYMBICORT) 160-4.5 MCG/ACT inhaler Inhale 2 puffs into the lungs 2 (two) times daily. 1 Inhaler 12  . cephALEXin (KEFLEX) 500 MG capsule Take 1 capsule (500 mg total) by mouth 2 (two) times daily. 14 capsule 0  . norethindrone (MICRONOR,CAMILA,ERRIN) 0.35 MG  tablet TAKE 1 TABLET BY MOUTH ONCE A DAY AT THE SAME TIME EACH DAY  11   No current facility-administered medications for this visit.    No Known Allergies     Objective:  BP 117/72   Pulse 76   Temp (!) 97.5 F (36.4 C) (Oral)   Resp 14   Wt 110 lb (49.9 kg)   LMP 07/01/2016 (Exact Date)   BMI 20.78 kg/m  Gen:  alert, not ill-appearing, no distress, appropriate for age HEENT: head normocephalic without obvious abnormality, conjunctiva and cornea clear, wearing glasses, orpoharynx clear, moist mucous membranes, trachea midline Pulm: Normal work of breathing, normal phonation, clear to auscultation bilaterally, no wheezes, rales or rhonchi CV: Normal rate, regular rhythm, s1 and s2 distinct, no murmurs, clicks or rubs  GI: abdomen soft, non-distended, non-tender, no organomegaly or palpable masses Neuro: alert and oriented x 3, no tremor MSK: extremities atraumatic, normal gait and station Skin: intact, no rashes on exposed skin, no jaundice, no cyanosis Psych: well-groomed, cooperative, good eye contact, affect full range, speech is articulate, and thought processes clear and goal-directed    Results for orders placed or performed in visit on 10/29/17 (from the past 72 hour(s))  POCT Urinalysis Dipstick  Status: Abnormal   Collection Time: 10/29/17 11:36 AM  Result Value Ref Range   Color, UA yellow    Clarity, UA clear    Glucose, UA Negative Negative   Bilirubin, UA negative    Ketones, UA negative    Spec Grav, UA 1.025 1.010 - 1.025   Blood, UA moderate    pH, UA 6.5 5.0 - 8.0   Protein, UA Negative Negative   Urobilinogen, UA 0.2 0.2 or 1.0 E.U./dL   Nitrite, UA negative    Leukocytes, UA Negative Negative   Appearance     Odor    POCT urine pregnancy     Status: Normal   Collection Time: 10/29/17 11:36 AM  Result Value Ref Range   Preg Test, Ur Negative Negative   No results found.    Assessment and Plan: 22 y.o. female with   Left lower quadrant  pain - Plan: POCT Urinalysis Dipstick, POCT urine pregnancy, US PELVIC COMPLETE WITH TRANSVAGINAL, CBC with Differential/Platelet, Comprehensive metabolic panel, Lipase, Urine Culture, cephALEXin (KEFLEX) 500 MG capsule  Microscopic hematuria - Plan: Urine Culture, cephALEXin (KEFLEX) 500 MG capsule  History of molar pregnancy  - afebrile, no tachycardia, benign abdominal exam, urine HCG negative - UA was positive for moderate microscopic blood. Urine culture pending. I am going to start empiric treatment for acute cystitis with Keflex bid - given history of molar pregnancy, I would like to rule out ovarian mass and pelvic pathology - CBC, CMP, lipase pending - if labs and pelvic US are unremarkable, will proceed with CT stone study    Patient education and anticipatory guidance given Patient agrees with treatment plan Follow-up as needed if symptoms worsen or fail to improve  Levonne Hubertharley E. Cummings PA-C

## 2017-10-30 ENCOUNTER — Other Ambulatory Visit: Payer: Self-pay

## 2017-10-30 LAB — URINE CULTURE
MICRO NUMBER:: 90825456
RESULT: NO GROWTH
SPECIMEN QUALITY: ADEQUATE

## 2017-11-03 NOTE — Addendum Note (Signed)
Addended by: Rayleigh Gillyard E on: 11/03/2017 11:40 AM   Modules accepted: Orders  

## 2018-09-08 DIAGNOSIS — N925 Other specified irregular menstruation: Secondary | ICD-10-CM | POA: Diagnosis not present

## 2018-09-08 DIAGNOSIS — N912 Amenorrhea, unspecified: Secondary | ICD-10-CM | POA: Diagnosis not present

## 2018-09-22 DIAGNOSIS — Z3481 Encounter for supervision of other normal pregnancy, first trimester: Secondary | ICD-10-CM | POA: Diagnosis not present

## 2018-11-24 DIAGNOSIS — Z3482 Encounter for supervision of other normal pregnancy, second trimester: Secondary | ICD-10-CM | POA: Diagnosis not present

## 2018-11-24 DIAGNOSIS — Z3689 Encounter for other specified antenatal screening: Secondary | ICD-10-CM | POA: Diagnosis not present

## 2018-11-24 DIAGNOSIS — Z3A19 19 weeks gestation of pregnancy: Secondary | ICD-10-CM | POA: Diagnosis not present

## 2019-01-04 DIAGNOSIS — Z20828 Contact with and (suspected) exposure to other viral communicable diseases: Secondary | ICD-10-CM | POA: Diagnosis not present

## 2019-01-04 DIAGNOSIS — R0989 Other specified symptoms and signs involving the circulatory and respiratory systems: Secondary | ICD-10-CM | POA: Diagnosis not present

## 2019-01-27 DIAGNOSIS — Z3482 Encounter for supervision of other normal pregnancy, second trimester: Secondary | ICD-10-CM | POA: Diagnosis not present

## 2019-01-31 DIAGNOSIS — O99810 Abnormal glucose complicating pregnancy: Secondary | ICD-10-CM | POA: Diagnosis not present

## 2019-02-10 DIAGNOSIS — Z3A30 30 weeks gestation of pregnancy: Secondary | ICD-10-CM | POA: Diagnosis not present

## 2019-02-10 DIAGNOSIS — Z23 Encounter for immunization: Secondary | ICD-10-CM | POA: Diagnosis not present

## 2019-02-24 DIAGNOSIS — Z23 Encounter for immunization: Secondary | ICD-10-CM | POA: Diagnosis not present

## 2019-02-24 DIAGNOSIS — Z3A32 32 weeks gestation of pregnancy: Secondary | ICD-10-CM | POA: Diagnosis not present

## 2019-03-24 DIAGNOSIS — Z3482 Encounter for supervision of other normal pregnancy, second trimester: Secondary | ICD-10-CM | POA: Diagnosis not present

## 2019-03-24 DIAGNOSIS — Z3A36 36 weeks gestation of pregnancy: Secondary | ICD-10-CM | POA: Diagnosis not present

## 2019-03-24 DIAGNOSIS — O99810 Abnormal glucose complicating pregnancy: Secondary | ICD-10-CM | POA: Diagnosis not present

## 2019-04-12 DIAGNOSIS — Z3A39 39 weeks gestation of pregnancy: Secondary | ICD-10-CM | POA: Diagnosis not present

## 2019-04-12 DIAGNOSIS — F329 Major depressive disorder, single episode, unspecified: Secondary | ICD-10-CM | POA: Diagnosis not present

## 2019-04-12 DIAGNOSIS — O99344 Other mental disorders complicating childbirth: Secondary | ICD-10-CM | POA: Diagnosis not present

## 2019-04-12 DIAGNOSIS — Z23 Encounter for immunization: Secondary | ICD-10-CM | POA: Diagnosis not present

## 2019-04-12 DIAGNOSIS — Z79899 Other long term (current) drug therapy: Secondary | ICD-10-CM | POA: Diagnosis not present

## 2019-04-12 DIAGNOSIS — Z3689 Encounter for other specified antenatal screening: Secondary | ICD-10-CM | POA: Diagnosis not present

## 2019-04-13 DIAGNOSIS — Z23 Encounter for immunization: Secondary | ICD-10-CM | POA: Diagnosis not present

## 2019-05-17 DIAGNOSIS — M5489 Other dorsalgia: Secondary | ICD-10-CM | POA: Diagnosis not present

## 2019-07-20 DIAGNOSIS — Z3482 Encounter for supervision of other normal pregnancy, second trimester: Secondary | ICD-10-CM | POA: Diagnosis not present

## 2019-07-20 DIAGNOSIS — Z3483 Encounter for supervision of other normal pregnancy, third trimester: Secondary | ICD-10-CM | POA: Diagnosis not present

## 2019-08-16 DIAGNOSIS — Z3042 Encounter for surveillance of injectable contraceptive: Secondary | ICD-10-CM | POA: Diagnosis not present

## 2019-08-16 DIAGNOSIS — J4520 Mild intermittent asthma, uncomplicated: Secondary | ICD-10-CM | POA: Diagnosis not present

## 2019-08-16 DIAGNOSIS — F419 Anxiety disorder, unspecified: Secondary | ICD-10-CM | POA: Diagnosis not present

## 2019-08-16 IMAGING — US US PELVIS COMPLETE TRANSABD/TRANSVAG
1 series · 14 of 25 positions shown · non-contrast
Comparison: None

CLINICAL DATA: LEFT lower quadrant pain for 4 weeks, history of
pregnancy, D & C, chemotherapy treatment in 4532



[Series 1: us pelvis complete transabd/transvag · 0.16mm/px · 14 of 64 slices shown]
[im 1/64]
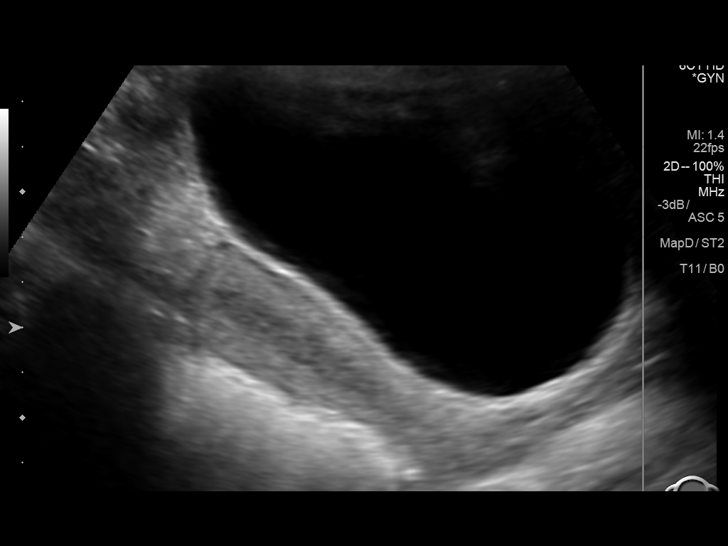
[im 6/64]
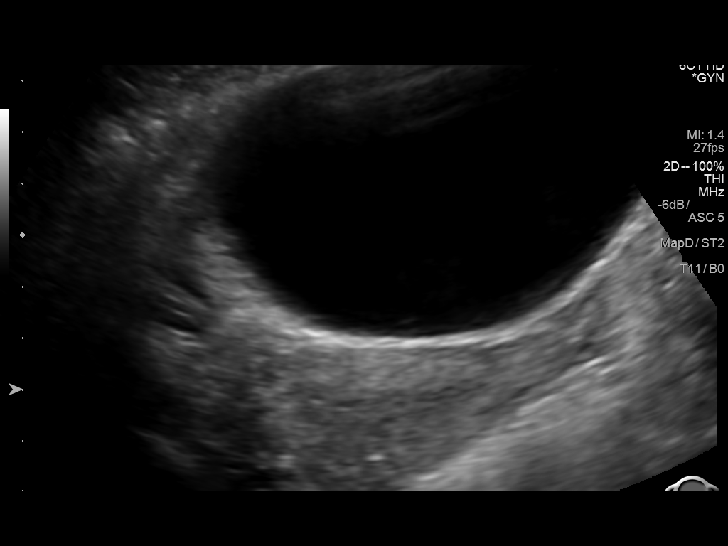
[im 11/64]
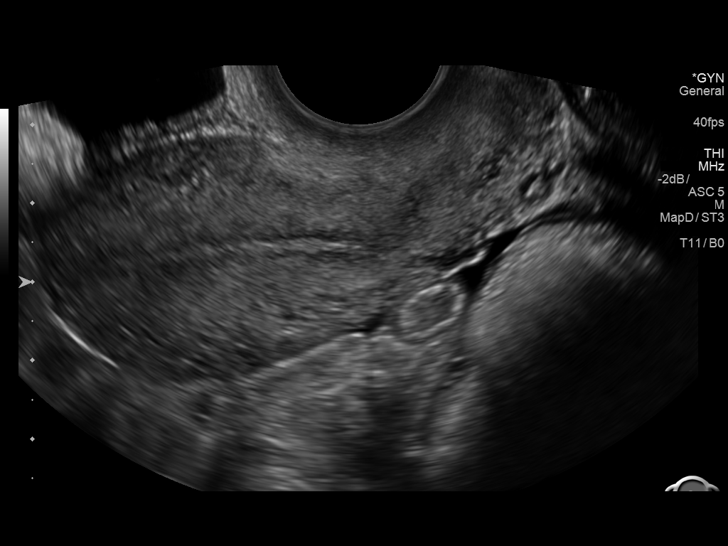
[im 16/64]
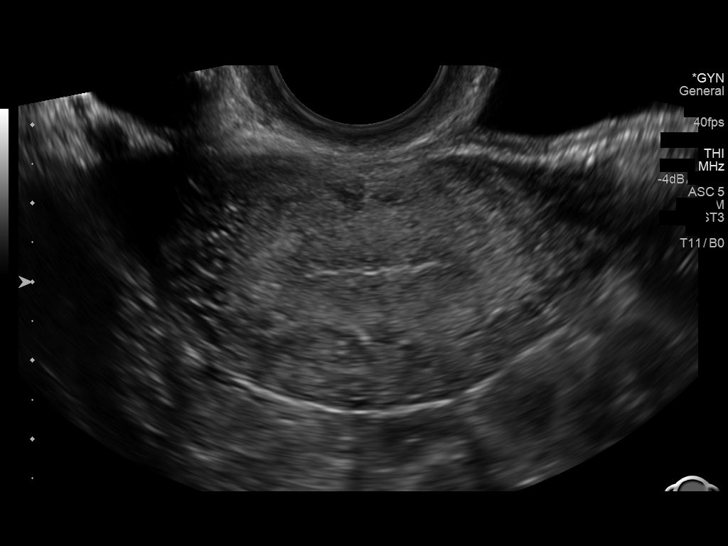
[im 22/64]
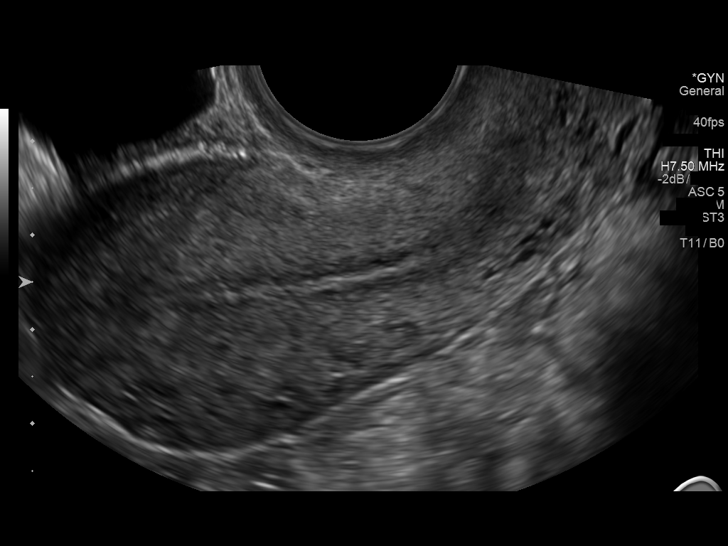
[im 24/64]
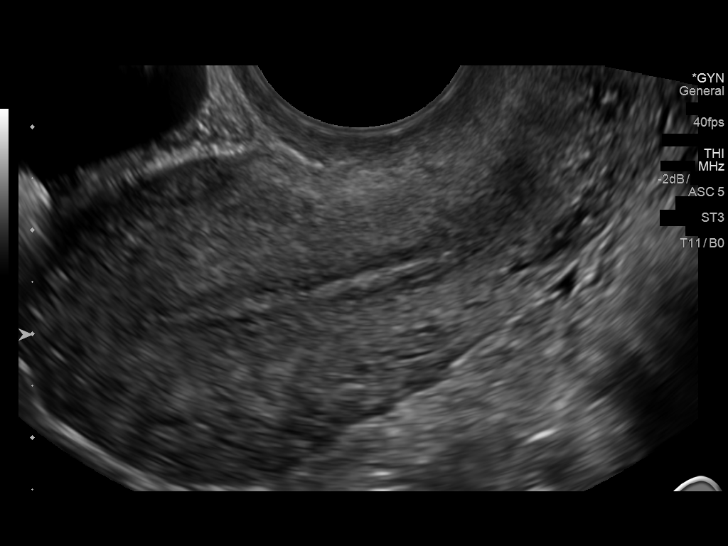
[im 29/64]
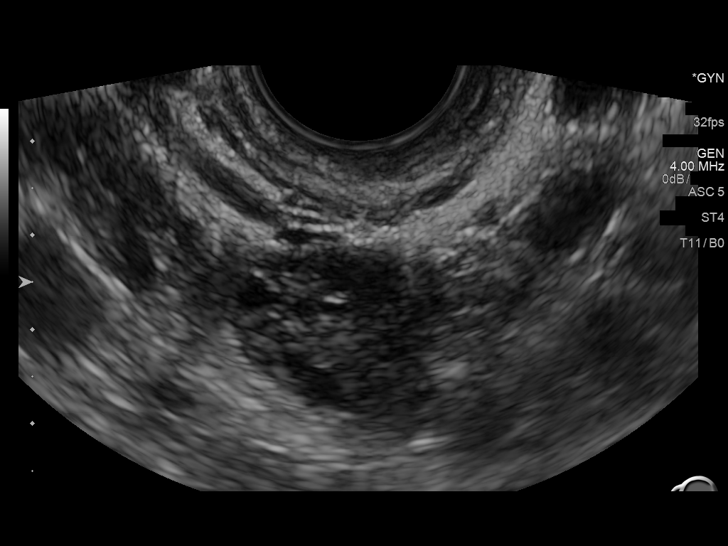
[im 35/64]
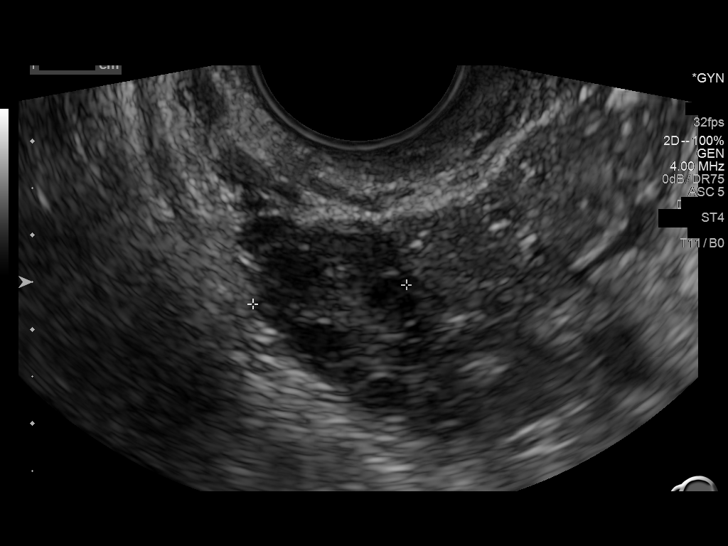
[im 40/64]
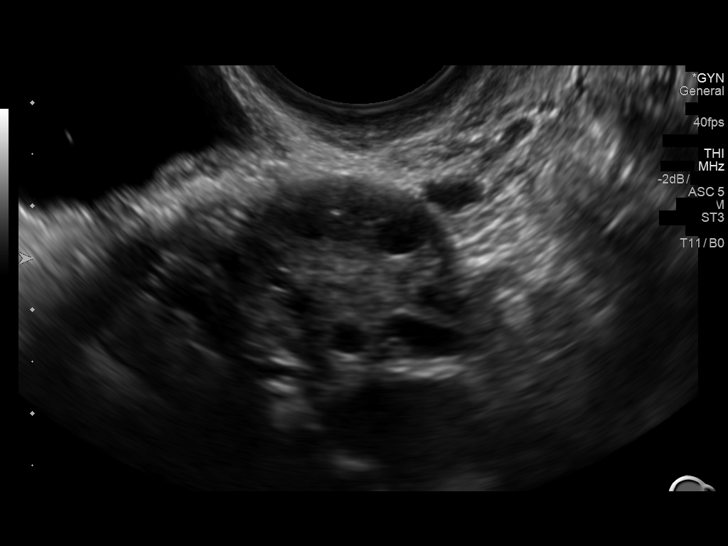
[im 43/64]
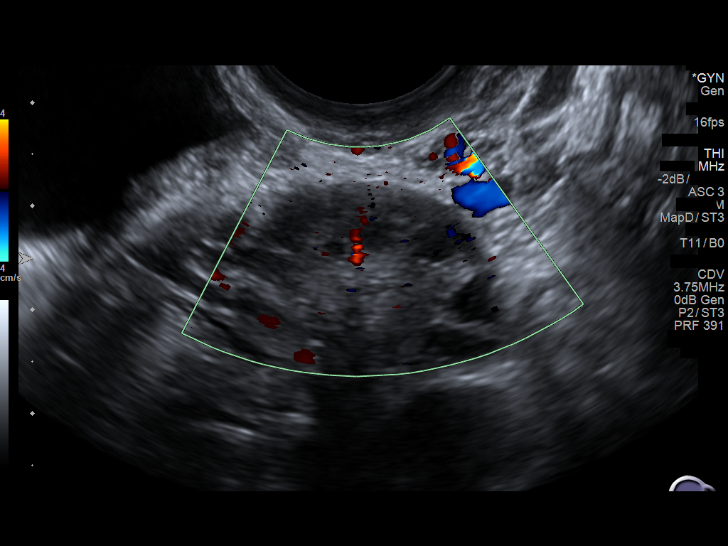
[im 48/64]
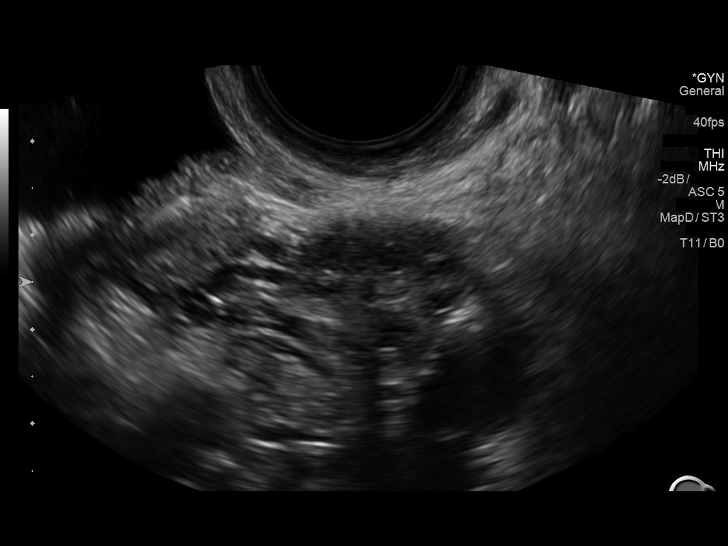
[im 53/64]
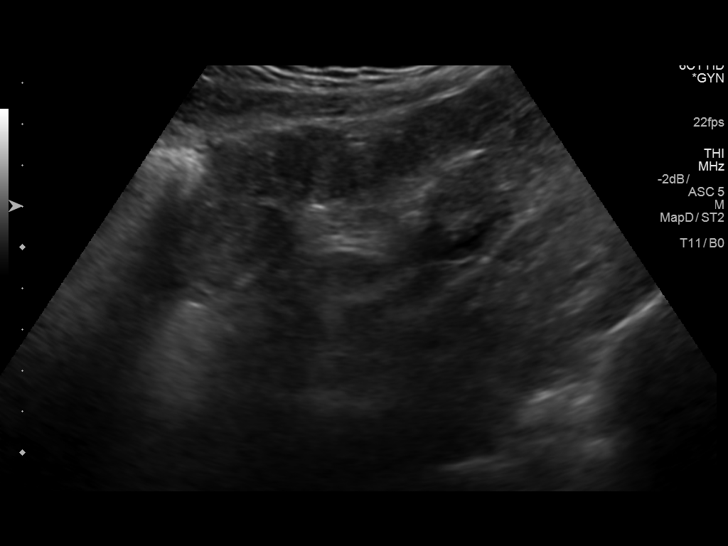
[im 58/64]
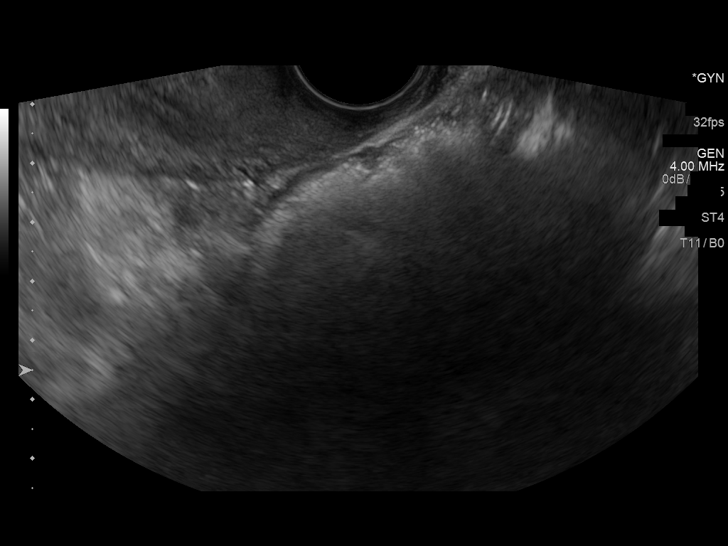
[im 64/64]
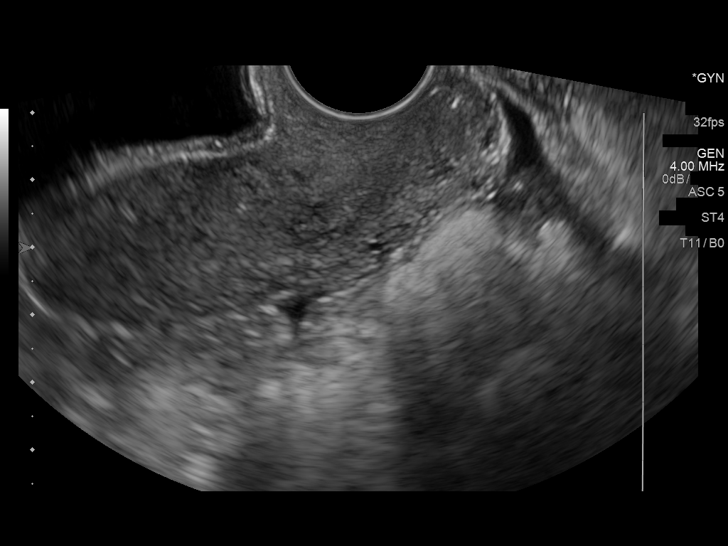

[14 of 25 positions shown; findings below may reference images not displayed]

FINDINGS: Uterus

Measurements: 6.7 x 3.2 x 4.9 cm.  Normal morphology without mass

Endometrium

Thickness: 2 mm thick, normal. No endometrial fluid or focal
abnormality

Right ovary

Measurements: 2.5 x 1.7 x 1.6 cm.  Normal morphology without mass

Left ovary

Measurements: 2.4 x 1.5 x 1.9 cm.  Normal morphology without mass

Other findings

Trace free pelvic fluid. No adnexal masses. Assessment of the adnexa
limited by bowel gas and stool.
IMPRESSION: Normal exam.

## 2021-05-24 ENCOUNTER — Ambulatory Visit: Payer: Managed Care, Other (non HMO) | Admitting: Obstetrics and Gynecology

## 2021-05-24 ENCOUNTER — Other Ambulatory Visit: Payer: Self-pay

## 2021-05-24 ENCOUNTER — Encounter: Payer: Self-pay | Admitting: Obstetrics and Gynecology

## 2021-05-24 VITALS — BP 118/78 | HR 101 | Ht 61.0 in | Wt 135.0 lb

## 2021-05-24 DIAGNOSIS — J4541 Moderate persistent asthma with (acute) exacerbation: Secondary | ICD-10-CM | POA: Diagnosis not present

## 2021-05-24 DIAGNOSIS — O3680X0 Pregnancy with inconclusive fetal viability, not applicable or unspecified: Secondary | ICD-10-CM

## 2021-05-24 LAB — HCG, QUANTITATIVE, PREGNANCY: HCG, Total, QN: 42 m[IU]/mL

## 2021-05-24 MED ORDER — BUDESONIDE-FORMOTEROL FUMARATE 160-4.5 MCG/ACT IN AERO: 2.0000 | INHALATION_SPRAY | Freq: Two times a day (BID) | RESPIRATORY_TRACT | 12 refills | Status: AC

## 2021-05-24 MED ORDER — SERTRALINE HCL 100 MG PO TABS: 100.0000 mg | ORAL_TABLET | Freq: Every day | ORAL | 1 refills | Status: AC

## 2021-05-24 NOTE — Progress Notes (Signed)
Pt states she is having brown discharge and occasional cramps. Pt thinks she passed "clots of tissue" on Sunday and Monday.

## 2021-05-24 NOTE — Progress Notes (Signed)
GYNECOLOGY ENCOUNTER NOTE  History:     Theresa Heath is a 26 y.o. [phone] female here for f/u from a recent visit to [facility] ED for bleeding and pain in pregnancy.  She was seen and diagnosed with pregnancy of unknown location and is here for f/u. She continues to have very light brown spotting. She recalls passing tissue like substance on Sunday at home. She is not having any pain.   Rh positive blood type.   HCG 1/26:  4716   Impression:   1. Possible gestational sac at the level of the cervix without evidence of fetal pole. Heterogeneity of the uterine fundus may represent clots/debris. Recommend interval follow-up ultrasound and beta-hCG trend.     Obstetric History OB History  Gravida Para Term Preterm AB Living  4 2 2   1 2   SAB IAB Ectopic Multiple Live Births  0            # Outcome Date GA Lbr Len/2nd Weight Sex Delivery Anes PTL Lv  4 Gravida           3 Molar           2 Term           1 Term             Past Medical History:  Diagnosis Date   Anxiety    Asthma    Molar pregnancy     Past Surgical History:  Procedure Laterality Date   DILATION AND CURETTAGE OF UTERUS     TONSILLECTOMY      Current Outpatient Medications on File Prior to Visit  Medication Sig Dispense Refill   budesonide-formoterol (SYMBICORT) 160-4.5 MCG/ACT inhaler Inhale 2 puffs into the lungs 2 (two) times daily. 1 Inhaler 12   sertraline (ZOLOFT) 100 MG tablet Take by mouth.     albuterol (PROVENTIL HFA;VENTOLIN HFA) 108 (90 Base) MCG/ACT inhaler Inhale 1-2 puffs into the lungs every 4 (four) hours as needed for wheezing or shortness of breath. (Patient not taking: Reported on 05/24/2021) 1 Inhaler 12   albuterol (PROVENTIL) (2.5 MG/3ML) 0.083% nebulizer solution Take 3 mLs (2.5 mg total) by nebulization every 6 (six) hours as needed for wheezing or shortness of breath. (Patient not taking: Reported on 05/24/2021) 75 mL 12   cephALEXin (KEFLEX) 500 MG capsule Take 1 capsule (500 mg  total) by mouth 2 (two) times daily. (Patient not taking: Reported on 05/24/2021) 14 capsule 0   norethindrone (MICRONOR,CAMILA,ERRIN) 0.35 MG tablet TAKE 1 TABLET BY MOUTH ONCE A DAY AT THE SAME TIME EACH DAY (Patient not taking: Reported on 05/24/2021)  11   No current facility-administered medications on file prior to visit.    No Known Allergies  Social History:  reports that she has never smoked. She has never used smokeless tobacco. She reports that she does not drink alcohol and does not use drugs.  Family History  Problem Relation Age of Onset   Diabetes Paternal Grandmother    Colon cancer Maternal Grandmother     The following portions of the patient's history were reviewed and updated as appropriate: allergies, current medications, past family history, past medical history, past social history, past surgical history and problem list.  Review of Systems Pertinent items noted in HPI and remainder of comprehensive ROS otherwise negative.  Physical Exam:  BP 118/78    Pulse (!) 101    Ht 5\' 1"  (1.549 m)    Wt 135 lb (61.2 kg)  LMP  (LMP Unknown)    Breastfeeding No    BMI 25.51 kg/m  CONSTITUTIONAL: Well-developed, well-nourished female in no acute distress.  HENT:  Normocephalic SKIN: Skin is warm and dry. MUSCULOSKELETAL: Normal range of motion.  NEUROLOGIC: Alert and oriented to person, place, and time.  PSYCHIATRIC: Normal mood and affect.    Assessment and Plan:   1. Pregnancy of unknown anatomic location  - B-HCG Quant - US OB LESS THAN 14 WEEKS WITH OB TRANSVAGINAL; Future - Provider visit in 1 week.   2. Moderate persistent asthma with acute exacerbation  - budesonide-formoterol (SYMBICORT) 160-4.5 MCG/ACT inhaler; Inhale 2 puffs into the lungs 2 (two) times daily.  Dispense: 1 each; Refill: 12    Parul Porcelli, Harolyn Rutherford, NP [facility] [employer], [facility]

## 2021-05-29 ENCOUNTER — Other Ambulatory Visit: Payer: Self-pay

## 2021-05-29 ENCOUNTER — Ambulatory Visit (INDEPENDENT_AMBULATORY_CARE_PROVIDER_SITE_OTHER): Payer: Managed Care, Other (non HMO)

## 2021-05-29 DIAGNOSIS — Z3687 Encounter for antenatal screening for uncertain dates: Secondary | ICD-10-CM | POA: Diagnosis not present

## 2021-05-29 DIAGNOSIS — O3680X0 Pregnancy with inconclusive fetal viability, not applicable or unspecified: Secondary | ICD-10-CM

## 2021-05-31 ENCOUNTER — Other Ambulatory Visit: Payer: Managed Care, Other (non HMO)

## 2021-06-04 ENCOUNTER — Encounter: Payer: Self-pay | Admitting: Obstetrics and Gynecology

## 2021-06-04 ENCOUNTER — Ambulatory Visit: Payer: Managed Care, Other (non HMO) | Admitting: Obstetrics and Gynecology

## 2021-06-04 ENCOUNTER — Other Ambulatory Visit: Payer: Self-pay

## 2021-06-04 VITALS — BP 111/67 | HR 89 | Resp 16 | Ht 61.0 in | Wt 134.0 lb

## 2021-06-04 DIAGNOSIS — O039 Complete or unspecified spontaneous abortion without complication: Secondary | ICD-10-CM | POA: Insufficient documentation

## 2021-06-04 NOTE — Progress Notes (Signed)
Subjective:  Theresa Heath is a 26 y.o. [phone] at Unknown who presents today for FU BHCG. She was seen in the office as a new patient on 2/3 following a visit to the ED for SAB. Results from that day showed an HCG of 42, which is down from 4700. She denies vaginal bleeding. She denies abdominal or pelvic pain.  Objective:  Physical Exam  Nursing note and vitals reviewed. Constitutional: She is oriented to person, place, and time. She appears well-developed and well-nourished. No distress.  HENT:  Head: Normocephalic.  Respiratory: Effort normal.  Neurological: She is alert and oriented to person, place, and time. Skin: Skin is warm and dry.  Psychiatric: She has a normal mood and affect.   No results found for this or any previous visit (from the past 24 hour(s)).  Assessment/Plan:  SAB Return to the office as needed Continue prenatal vitamins Support given  Theresa Heath I, NP 06/04/2021 9:03 AM

## 2024-05-27 ENCOUNTER — Other Ambulatory Visit: Payer: Self-pay | Admitting: Medical Genetics

## 2024-06-01 ENCOUNTER — Other Ambulatory Visit
